# Patient Record
Sex: Female | Born: 2000 | Race: Black or African American | Hispanic: No | Marital: Single | Smoking: Never smoker
Health system: Southern US, Community
[De-identification: ages and names within clinical notes are randomized; demographics above are authoritative.]

---
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
## 2020-08-13 ENCOUNTER — Other Ambulatory Visit: Payer: Self-pay

## 2020-08-13 ENCOUNTER — Emergency Department (HOSPITAL_COMMUNITY): Payer: 59

## 2020-08-13 ENCOUNTER — Emergency Department (HOSPITAL_COMMUNITY)
Admission: EM | Admit: 2020-08-13 | Discharge: 2020-08-13 | Disposition: A | Discharge status: Home / Self Care | Payer: 59 | Attending: Emergency Medicine | Admitting: Emergency Medicine

## 2020-08-13 ENCOUNTER — Encounter (HOSPITAL_COMMUNITY): Payer: Self-pay

## 2020-08-13 DIAGNOSIS — R110 Nausea: Secondary | ICD-10-CM | POA: Insufficient documentation

## 2020-08-13 DIAGNOSIS — R42 Dizziness and giddiness: Secondary | ICD-10-CM | POA: Diagnosis not present

## 2020-08-13 DIAGNOSIS — R55 Syncope and collapse: Secondary | ICD-10-CM | POA: Diagnosis present

## 2020-08-13 DIAGNOSIS — Z79899 Other long term (current) drug therapy: Secondary | ICD-10-CM | POA: Diagnosis not present

## 2020-08-13 DIAGNOSIS — R0602 Shortness of breath: Secondary | ICD-10-CM | POA: Insufficient documentation

## 2020-08-13 LAB — URINALYSIS, ROUTINE W REFLEX MICROSCOPIC
Bilirubin Urine: NEGATIVE
Glucose, UA: NEGATIVE mg/dL
Ketones, ur: NEGATIVE mg/dL
Leukocytes,Ua: NEGATIVE
Nitrite: NEGATIVE
Protein, ur: 30 mg/dL — AB
Specific Gravity, Urine: 1.005 (ref 1.005–1.030)
pH: 6 (ref 5.0–8.0)

## 2020-08-13 LAB — CBG MONITORING, ED
Glucose-Capillary: 102 mg/dL — ABNORMAL HIGH (ref 70–99)
Glucose-Capillary: 79 mg/dL (ref 70–99)

## 2020-08-13 LAB — D-DIMER, QUANTITATIVE: D-Dimer, Quant: 0.53 ug/mL-FEU — ABNORMAL HIGH (ref 0.00–0.50)

## 2020-08-13 LAB — CBC
HCT: 41.5 % (ref 36.0–46.0)
Hemoglobin: 13.6 g/dL (ref 12.0–15.0)
MCH: 30.1 pg (ref 26.0–34.0)
MCHC: 32.8 g/dL (ref 30.0–36.0)
MCV: 91.8 fL (ref 80.0–100.0)
Platelets: 225 10*3/uL (ref 150–400)
RBC: 4.52 MIL/uL (ref 3.87–5.11)
RDW: 12 % (ref 11.5–15.5)
WBC: 3.9 10*3/uL — ABNORMAL LOW (ref 4.0–10.5)
nRBC: 0 % (ref 0.0–0.2)

## 2020-08-13 LAB — BASIC METABOLIC PANEL
Anion gap: 11 (ref 5–15)
BUN: 5 mg/dL — ABNORMAL LOW (ref 6–20)
CO2: 22 mmol/L (ref 22–32)
Calcium: 8.8 mg/dL — ABNORMAL LOW (ref 8.9–10.3)
Chloride: 104 mmol/L (ref 98–111)
Creatinine, Ser: 0.91 mg/dL (ref 0.44–1.00)
GFR calc Af Amer: >60 mL/min (ref >60)
GFR calc non Af Amer: >60 mL/min (ref >60)
Glucose, Bld: 105 mg/dL — ABNORMAL HIGH (ref 70–99)
Potassium: 3.5 mmol/L (ref 3.5–5.1)
Sodium: 137 mmol/L (ref 135–145)

## 2020-08-13 LAB — I-STAT BETA HCG BLOOD, ED (MC, WL, AP ONLY): I-stat hCG, quantitative: <5 m[IU]/mL (ref <5)

## 2020-08-13 MED ORDER — IOHEXOL 350 MG/ML SOLN
120.0000 mL | Freq: Once | INTRAVENOUS | Status: AC | PRN
  Administered 2020-08-13: 120 mL via INTRAVENOUS

## 2020-08-13 MED ORDER — SODIUM CHLORIDE 0.9 % IV BOLUS
1000.0000 mL | Freq: Once | INTRAVENOUS | Status: AC
  Administered 2020-08-13: 1000 mL via INTRAVENOUS

## 2020-08-13 NOTE — ED Notes (Signed)
Patient transported to CT 

## 2020-08-13 NOTE — ED Provider Notes (Signed)
[facility] EMERGENCY DEPARTMENT Provider Note   CSN: [id number] Arrival date & time: 08/13/20  6606     History No chief complaint on file.   Katherine Green is a 20 y.o. female.   Loss of Consciousness Episode history:  Single Most recent episode:  Today Duration:  15 seconds Progression:  Resolved Chronicity:  New Context comment:  [facility] Witnessed: yes   Relieved by:  Nothing Worsened by:  Nothing Ineffective treatments:  None tried Associated symptoms: nausea and shortness of breath   Associated symptoms: no chest pain, no difficulty breathing, no fever, no focal weakness, no headaches, no palpitations, no recent injury and no vomiting        History reviewed. No pertinent past medical history.  There are no problems to display for this patient.   History reviewed. No pertinent surgical history.   OB History   No obstetric history on file.     No family history on file.  Social History   Tobacco Use  . Smoking status: Never Smoker  Substance Use Topics  . Alcohol use: No  . Drug use: No    Home Medications Prior to Admission medications   Medication Sig Start Date End Date Taking? Authorizing Provider  SRONYX 0.1-20 MG-MCG tablet Take 1 tablet by mouth daily. 07/16/20  Yes [provider]  dicyclomine (BENTYL) 20 MG tablet Take 1 tablet (20 mg total) by mouth 2 (two) times daily as needed (abdominal pain/cramping). Patient not taking: Reported on 08/13/2020 01/24/17   Antony Madura, PA-C  ibuprofen (ADVIL,MOTRIN) 600 MG tablet Take 1 tablet (600 mg total) by mouth every 6 (six) hours as needed for mild pain, moderate pain or cramping. Patient not taking: Reported on 08/13/2020 01/24/17   Antony Madura, PA-C  ondansetron (ZOFRAN ODT) 4 MG disintegrating tablet Take 1 tablet (4 mg total) by mouth every 8 (eight) hours as needed for nausea or vomiting. Patient not taking: Reported on 08/13/2020 01/24/17   Antony Madura, PA-C    Allergies    Patient has no known allergies.  Review of Systems   Review of Systems  Constitutional: Negative for chills and fever.  HENT: Negative for congestion and rhinorrhea.   Respiratory: Positive for shortness of breath. Negative for cough.   Cardiovascular: Positive for syncope. Negative for chest pain and palpitations.  Gastrointestinal: Positive for nausea. Negative for diarrhea and vomiting.  Genitourinary: Negative for difficulty urinating and dysuria.  Musculoskeletal: Negative for arthralgias and back pain.  Skin: Negative for rash and wound.  Neurological: Positive for syncope and light-headedness. Negative for focal weakness and headaches.    Physical Exam Updated Vital Signs BP (!) 126/95 (BP Location: Right Arm)   Pulse (!) 101   Temp 99.6 F (37.6 C) (Oral)   Resp 16   SpO2 100%   Physical Exam Vitals and nursing note reviewed. Exam conducted with a chaperone present.  Constitutional:      General: She is not in acute distress.    Appearance: Normal appearance.  HENT:     Head: Normocephalic and atraumatic.     Nose: No rhinorrhea.  Eyes:     General:        Right eye: No discharge.        Left eye: No discharge.     Conjunctiva/sclera: Conjunctivae normal.  Cardiovascular:     Rate and Rhythm: Regular rhythm. Tachycardia present.  Pulmonary:     Effort: Pulmonary effort is normal.  No respiratory distress.     Breath sounds: No stridor.  Abdominal:     General: Abdomen is flat. There is no distension.     Palpations: Abdomen is soft.  Musculoskeletal:        General: No tenderness or signs of injury.  Skin:    General: Skin is warm and dry.     Capillary Refill: Capillary refill takes less than 2 seconds.  Neurological:     General: No focal deficit present.     Mental Status: She is alert. Mental status is at baseline.     Motor: No weakness.  Psychiatric:        Mood and Affect: Mood normal.        Behavior: Behavior  normal.     ED Results / Procedures / Treatments   Labs (all labs ordered are listed, but only abnormal results are displayed) Labs Reviewed  BASIC METABOLIC PANEL - Abnormal; Notable for the following components:      Result Value   Glucose, Bld 105 (*)    BUN 5 (*)    Calcium 8.8 (*)    All other components within normal limits  CBC - Abnormal; Notable for the following components:   WBC 3.9 (*)    All other components within normal limits  URINALYSIS, ROUTINE W REFLEX MICROSCOPIC - Abnormal; Notable for the following components:   Hgb urine dipstick SMALL (*)    Protein, ur 30 (*)    Bacteria, UA RARE (*)    All other components within normal limits  D-DIMER, QUANTITATIVE (NOT AT [facility]) - Abnormal; Notable for the following components:   D-Dimer, Quant 0.53 (*)    All other components within normal limits  CBG MONITORING, ED - Abnormal; Notable for the following components:   Glucose-Capillary 102 (*)    All other components within normal limits  I-STAT BETA HCG BLOOD, ED ([facility], [facility], [facility] ONLY)  CBG MONITORING, ED    EKG EKG Interpretation  Date/Time:  Saturday August 13 2020 08:42:05 EDT Ventricular Rate:  127 PR Interval:  156 QRS Duration: 72 QT Interval:  296 QTC Calculation: 430 R Axis:   76 Text Interpretation: Sinus tachycardia Right atrial enlargement T wave abnormality, consider inferolateral ischemia Abnormal ECG Confirmed by Cherlynn Perches ([id number]) on 08/13/2020 12:04:47 PM   Radiology CT Angio Chest PE W and/or Wo Contrast  Result Date: 08/13/2020 CLINICAL DATA:  No 8 for pulmonary embolus. EXAM: CT ANGIOGRAPHY CHEST WITH CONTRAST TECHNIQUE: Multidetector CT imaging of the chest was performed using the standard protocol during bolus administration of intravenous contrast. Multiplanar CT image reconstructions and MIPs were obtained to evaluate the vascular anatomy. CONTRAST:  OMNIPAQUE IOHEXOL 350 MG/ML SOLN COMPARISON:  None. FINDINGS: Cardiovascular: Normal  heart size. Aorta and main pulmonary artery normal in caliber. Adequate opacification of the pulmonary arterial system. No intraluminal filling defect identified to suggest acute pulmonary embolus. Mediastinum/Nodes: No enlarged axillary, mediastinal or hilar lymphadenopathy. Normal appearance of the esophagus. Lungs/Pleura: Central airways are patent. 2 mm subpleural left lower lobe nodule (image 76; series 16). Upper Abdomen: Unremarkable. Musculoskeletal: No aggressive or acute appearing osseous lesions. Review of the MIP images confirms the above findings. IMPRESSION: 1. No evidence for acute pulmonary embolus. 2. No acute process within the chest. Electronically Signed   By: Annia Belt M.D.   On: 08/13/2020 15:17    Procedures Procedures (including critical care time)  Medications Ordered in ED Medications  sodium chloride 0.9 % bolus 1,000 mL (  1,000 mLs Intravenous New Bag/Given 08/13/20 1331)  iohexol (OMNIPAQUE) 350 MG/ML injection 120 mL (120 mLs Intravenous Contrast Given 08/13/20 1507)    ED Course  I have reviewed the triage vital signs and the nursing notes.  Pertinent labs & imaging results that were available during my care of the patient were reviewed by me and considered in my medical decision making (see chart for details).    MDM Rules/Calculators/A&P                          Syncope, witnessed, nothing to eat or drink today prior, feeling better now.  Describes vasovagal type symptoms.  Denies chest pain, no history of early cardiac death.  Well-appearing.  No medical problems, on hormonal birth control, that plus tachycardia she will need a D-dimer.  EKG shows sinus tachycardia with biphasic T waves in the lateral leads, otherwise unremarkable.  Heart rate is improved from that.  She will get IV fluids as I feel there is some dehydration contributing to her symptoms.  Blood pressure is normal neuro exam is normal cardiac exam is normal.  Laboratory studies that were obtained  at triage unremarkable after my review for likely cause of her symptoms.  D-dimer is elevated she will get a CT PE study.  She is got IV fluids.  She is feeling better.  She will likely be safe for discharge home with a negative PET scan.  She will get anticoagulation if she has pulmonary embolism, vasovagal syncope sounds like the leading culprit though.  CT scan is reviewed by radiology myself, no acute cardiopulmonary pathology.  Patient likely was mildly dehydrated or have vasovagal symptoms.  She is safe for outpatient follow-up.  Strict return precautions are given.  Final Clinical Impression(s) / ED Diagnoses Final diagnoses:  Syncope, unspecified syncope type    Rx / DC Orders ED Discharge Orders    None       Sabino Donovan, MD 08/13/20 1521

## 2020-08-13 NOTE — ED Triage Notes (Signed)
Patient arrived by [facility] FOR syncope/ seizure after standing in line at [employer] for 1 hour this am. Patient arrived alert and oriented, and complains of finger pain where she scraped pavement with syncopal event. Patient also reports that she did not eat this am and is currently on menstrual cycle

## 2020-08-13 NOTE — Discharge Instructions (Signed)
Make sure to hydrate well, and follow up with your primary care provider.

## 2021-12-01 IMAGING — CT CT ANGIO CHEST
2 of 12 series · 11 of 46 positions shown · IV contrast (APPLIED)
Comparison: None.

CLINICAL DATA: No 8 for pulmonary embolus.

EXAM:
CT ANGIOGRAPHY CHEST WITH CONTRAST
TECHNIQUE: Multidetector CT imaging of the chest was performed using the
standard protocol during bolus administration of intravenous
contrast. Multiplanar CT image reconstructions and MIPs were
obtained to evaluate the vascular anatomy.
CONTRAST:  120mL OMNIPAQUE IOHEXOL 350 MG/ML SOLN

[Series 7: thins · axial · 0.58mm/px · z∈[-7,+251]mm · 10 of 450 slices shown]
[im 41/450  lung]
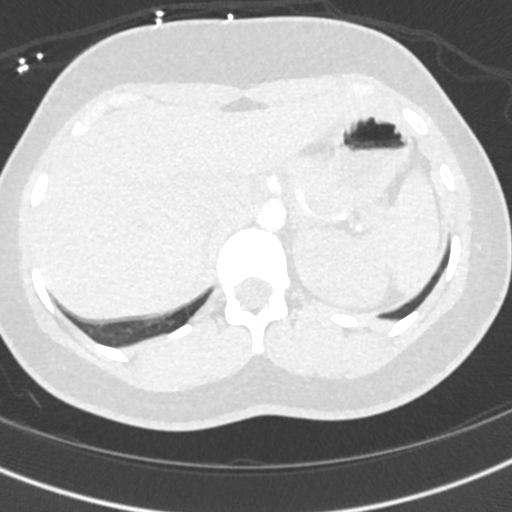
[im 82/450  soft-tissue]
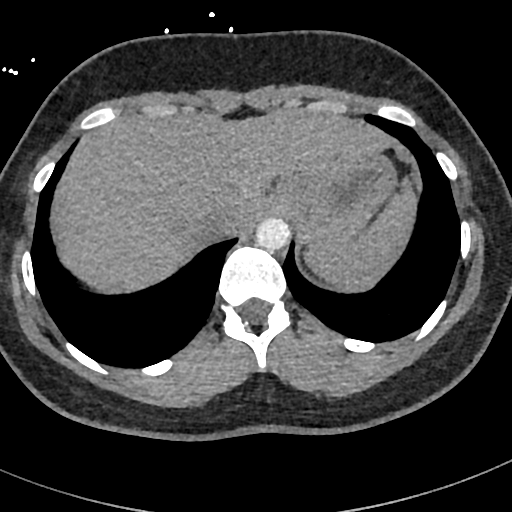
[im 123/450  lung]
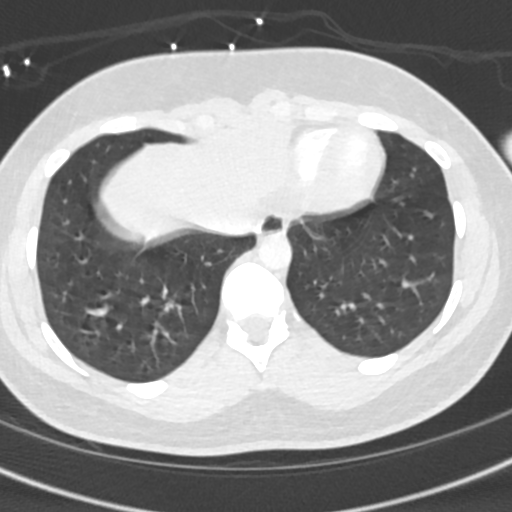
[im 164/450  soft-tissue]
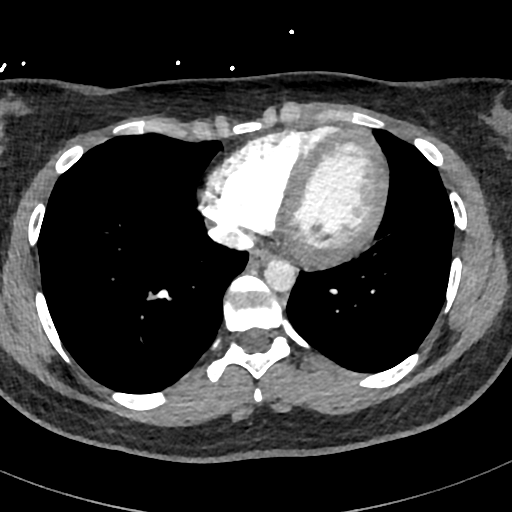
[im 205/450  lung]
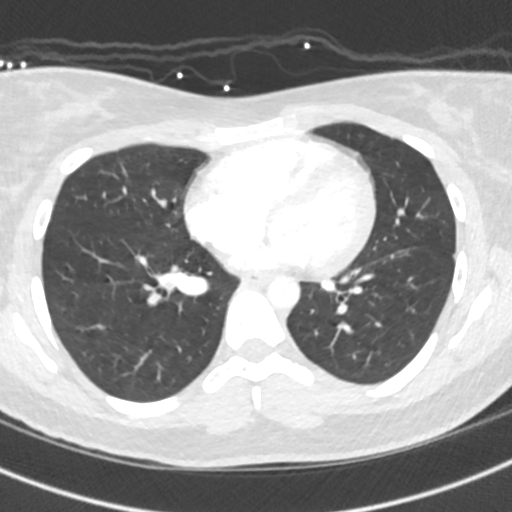
[im 245/450  soft-tissue]
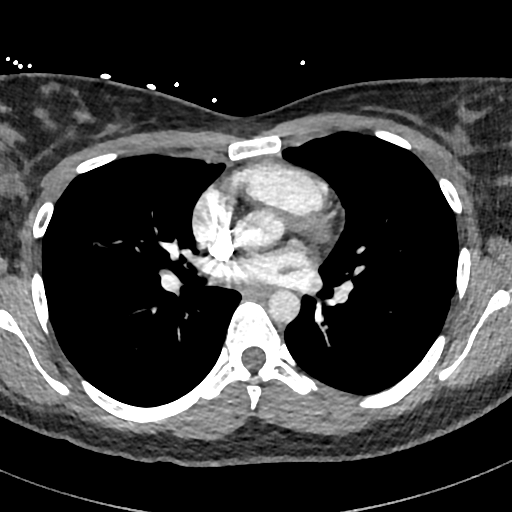
[im 286/450  lung]
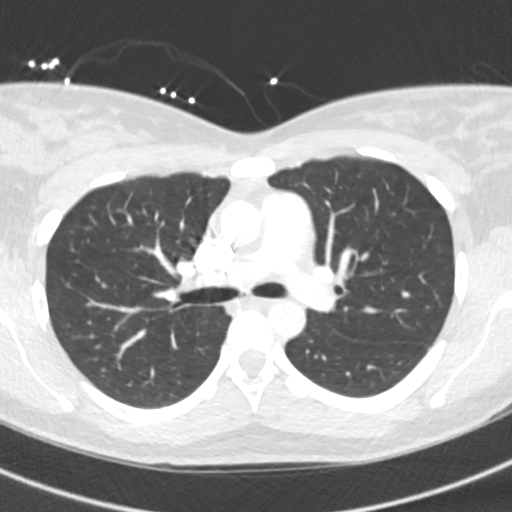
[im 327/450  soft-tissue]
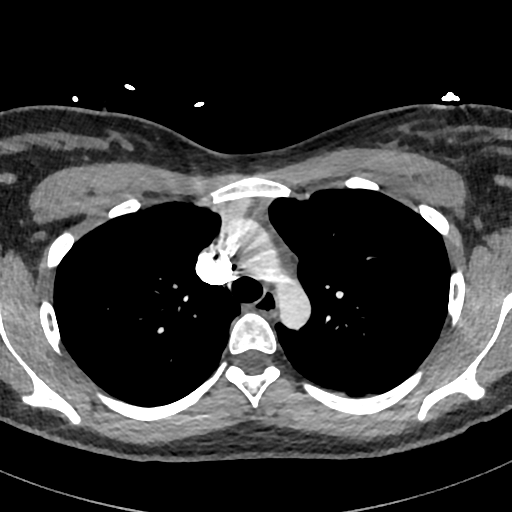
[im 368/450  lung]
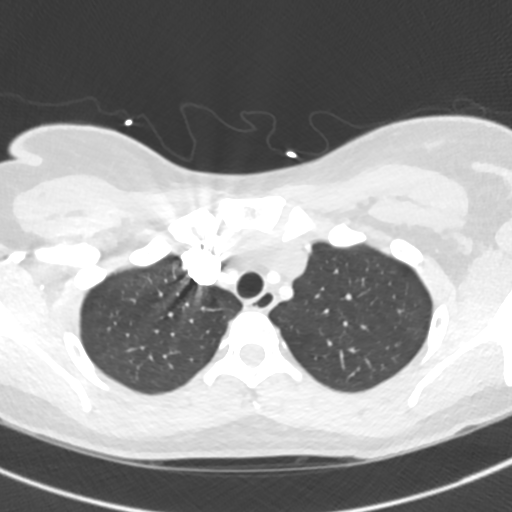
[im 409/450  soft-tissue]
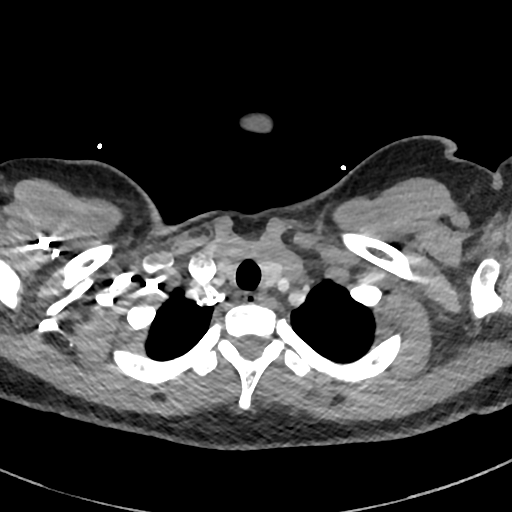

[Series 17: cor · coronal · 0.66mm/px · 1 of 101 slices shown]
[im 51/101  soft-tissue]
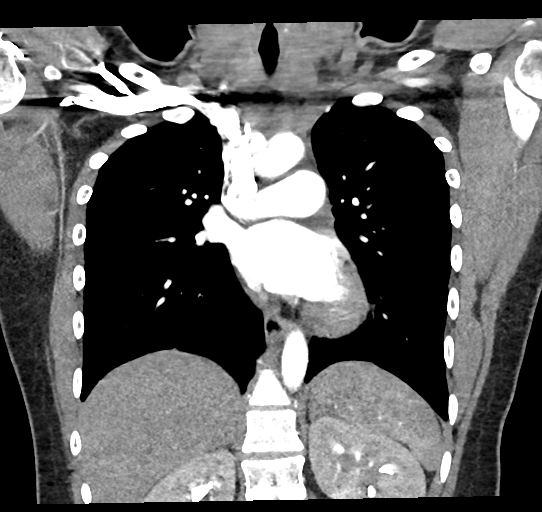

[11 of 46 positions shown; findings below may reference images not displayed]

FINDINGS: Cardiovascular: Normal heart size. Aorta and main pulmonary artery
normal in caliber. Adequate opacification of the pulmonary arterial
system. No intraluminal filling defect identified to suggest acute
pulmonary embolus.

Mediastinum/Nodes: No enlarged axillary, mediastinal or hilar
lymphadenopathy. Normal appearance of the esophagus.

Lungs/Pleura: Central airways are patent. 2 mm subpleural left lower
lobe nodule (image 76; series 16).

Upper Abdomen: Unremarkable.

Musculoskeletal: No aggressive or acute appearing osseous lesions.

Review of the MIP images confirms the above findings.
IMPRESSION: 1. No evidence for acute pulmonary embolus.
2. No acute process within the chest.
